# Patient Record
Sex: Male | Born: 1950 | Race: White | Hispanic: No | Marital: Married | Smoking: Current every day smoker
Health system: Southern US, Community
[De-identification: ages and names within clinical notes are randomized; demographics above are authoritative.]

## PROBLEM LIST (undated history)

## (undated) DIAGNOSIS — F32A Depression, unspecified: Secondary | ICD-10-CM

## (undated) DIAGNOSIS — N059 Unspecified nephritic syndrome with unspecified morphologic changes: Secondary | ICD-10-CM

## (undated) DIAGNOSIS — F329 Major depressive disorder, single episode, unspecified: Secondary | ICD-10-CM

## (undated) DIAGNOSIS — I10 Essential (primary) hypertension: Secondary | ICD-10-CM

## (undated) DIAGNOSIS — K219 Gastro-esophageal reflux disease without esophagitis: Secondary | ICD-10-CM

## (undated) DIAGNOSIS — F419 Anxiety disorder, unspecified: Secondary | ICD-10-CM

## (undated) HISTORY — PX: FINGER AMPUTATION: SHX636

## (undated) HISTORY — PX: OTHER SURGICAL HISTORY: SHX169

---

## 2013-11-04 ENCOUNTER — Ambulatory Visit: Payer: Self-pay

## 2015-09-13 ENCOUNTER — Other Ambulatory Visit: Payer: Self-pay | Admitting: Orthopedic Surgery

## 2015-09-13 DIAGNOSIS — M25512 Pain in left shoulder: Secondary | ICD-10-CM

## 2015-10-03 ENCOUNTER — Ambulatory Visit
Admission: RE | Admit: 2015-10-03 | Discharge: 2015-10-03 | Disposition: A | Discharge status: Home / Self Care | Payer: Medicare Other | Source: Ambulatory Visit | Attending: Orthopedic Surgery | Admitting: Orthopedic Surgery

## 2015-10-03 DIAGNOSIS — M25512 Pain in left shoulder: Secondary | ICD-10-CM | POA: Diagnosis not present

## 2015-10-03 DIAGNOSIS — M19012 Primary osteoarthritis, left shoulder: Secondary | ICD-10-CM | POA: Insufficient documentation

## 2015-10-03 DIAGNOSIS — M67814 Other specified disorders of tendon, left shoulder: Secondary | ICD-10-CM | POA: Diagnosis not present

## 2015-10-03 DIAGNOSIS — M25412 Effusion, left shoulder: Secondary | ICD-10-CM | POA: Diagnosis not present

## 2015-10-10 ENCOUNTER — Other Ambulatory Visit: Payer: Self-pay | Admitting: Orthopedic Surgery

## 2015-10-23 ENCOUNTER — Encounter
Admission: RE | Admit: 2015-10-23 | Discharge: 2015-10-23 | Disposition: A | Discharge status: Home / Self Care | Payer: Medicare Other | Source: Ambulatory Visit | Attending: Orthopedic Surgery | Admitting: Orthopedic Surgery

## 2015-10-23 ENCOUNTER — Other Ambulatory Visit: Payer: Medicare Other

## 2015-10-23 DIAGNOSIS — Z01812 Encounter for preprocedural laboratory examination: Secondary | ICD-10-CM | POA: Diagnosis not present

## 2015-10-23 DIAGNOSIS — Z01810 Encounter for preprocedural cardiovascular examination: Secondary | ICD-10-CM | POA: Diagnosis present

## 2015-10-23 DIAGNOSIS — I10 Essential (primary) hypertension: Secondary | ICD-10-CM | POA: Insufficient documentation

## 2015-10-23 HISTORY — DX: Major depressive disorder, single episode, unspecified: F32.9

## 2015-10-23 HISTORY — DX: Essential (primary) hypertension: I10

## 2015-10-23 HISTORY — DX: Anxiety disorder, unspecified: F41.9

## 2015-10-23 HISTORY — DX: Gastro-esophageal reflux disease without esophagitis: K21.9

## 2015-10-23 HISTORY — DX: Unspecified nephritic syndrome with unspecified morphologic changes: N05.9

## 2015-10-23 HISTORY — DX: Depression, unspecified: F32.A

## 2015-10-23 LAB — PROTIME-INR
INR: 1.02
Prothrombin Time: 13.6 seconds (ref 11.4–15.0)

## 2015-10-23 LAB — CBC WITH DIFFERENTIAL/PLATELET
Basophils Absolute: 0.1 10*3/uL (ref 0–0.1)
Basophils Relative: 1 %
Eosinophils Absolute: 0.1 10*3/uL (ref 0–0.7)
Eosinophils Relative: 2 %
HEMATOCRIT: 47 % (ref 40.0–52.0)
HEMOGLOBIN: 16.6 g/dL (ref 13.0–18.0)
LYMPHS ABS: 1.5 10*3/uL (ref 1.0–3.6)
LYMPHS PCT: 22 %
MCH: 33.4 pg (ref 26.0–34.0)
MCHC: 35.3 g/dL (ref 32.0–36.0)
MCV: 94.7 fL (ref 80.0–100.0)
MONOS PCT: 14 %
Monocytes Absolute: 1 10*3/uL (ref 0.2–1.0)
NEUTROS ABS: 4.2 10*3/uL (ref 1.4–6.5)
NEUTROS PCT: 61 %
Platelets: 244 10*3/uL (ref 150–440)
RBC: 4.96 MIL/uL (ref 4.40–5.90)
RDW: 13.2 % (ref 11.5–14.5)
WBC: 6.8 10*3/uL (ref 3.8–10.6)

## 2015-10-23 LAB — BASIC METABOLIC PANEL
ANION GAP: 3 — AB (ref 5–15)
BUN: 15 mg/dL (ref 6–20)
CHLORIDE: 104 mmol/L (ref 101–111)
CO2: 30 mmol/L (ref 22–32)
Calcium: 9.6 mg/dL (ref 8.9–10.3)
Creatinine, Ser: 1.03 mg/dL (ref 0.61–1.24)
GFR calc non Af Amer: >60 mL/min (ref >60)
Glucose, Bld: 105 mg/dL — ABNORMAL HIGH (ref 65–99)
POTASSIUM: 3.9 mmol/L (ref 3.5–5.1)
Sodium: 137 mmol/L (ref 135–145)

## 2015-10-23 LAB — APTT: aPTT: 27 seconds (ref 24–36)

## 2015-10-23 NOTE — Patient Instructions (Signed)
  Your procedure is scheduled on: Wednesday 11/01/15 Report to Day Surgery. 2ND FLOOR MEDICAL MALL ENTRANCE To find out your arrival time please call [phone] between 1PM - 3PM on Tuesday 10/31/15.  Remember: Instructions that are not followed completely may result in serious medical risk, up to and including death, or upon the discretion of your surgeon and anesthesiologist your surgery may need to be rescheduled.    __X__ 1. Do not eat food or drink liquids after midnight. No gum chewing or hard candies.     __X__ 2. No Alcohol for 24 hours before or after surgery.   ____ 3. Bring all medications with you on the day of surgery if instructed.    __X__ 4. Notify your doctor if there is any change in your medical condition     (cold, fever, infections).     Do not wear jewelry, make-up, hairpins, clips or nail polish.  Do not wear lotions, powders, or perfumes.   Do not shave 48 hours prior to surgery. Men may shave face and neck.  Do not bring valuables to the hospital.    [facility] belongings or valuables.               Contacts, dentures or bridgework may not be worn into surgery.  Leave your suitcase in the car. After surgery it may be brought to your room.  For patients admitted to the hospital, discharge time is determined by your                treatment team.   Patients discharged the day of surgery will not be allowed to drive home.   Please read over the following fact sheets that you were given:   Surgical Site Infection Prevention   __X__ Take these medicines the morning of surgery with A SIP OF WATER:    1. CLONAZEPAM  2. EFFEXOR  3. RANITIDINE AT BEDTIME AND DAY OF   4.  5.  6.  ____ Fleet Enema (as directed)   __X__ USE CHG SOAP  ____ Use inhalers on the day of surgery  ____ Stop metformin 2 days prior to surgery    ____ Take 1/2 of usual insulin dose the night before surgery and none on the morning of surgery.   __X__ Stop  Coumadin/Plavix/aspirin on 10/24/15 LAST DOSE  __X__ Stop Anti-inflammatories on 10/24/15 LAST DOSE OF IBUPROFEN   ____ Stop supplements until after surgery.    ____ Bring C-Pap to the hospital.

## 2015-11-01 ENCOUNTER — Ambulatory Visit
Admission: RE | Admit: 2015-11-01 | Discharge: 2015-11-01 | Disposition: A | Discharge status: Home / Self Care | Payer: Medicare Other | Source: Ambulatory Visit | Attending: Orthopedic Surgery | Admitting: Orthopedic Surgery

## 2015-11-01 ENCOUNTER — Ambulatory Visit: Payer: Medicare Other | Admitting: Anesthesiology

## 2015-11-01 ENCOUNTER — Encounter: Payer: Self-pay | Admitting: *Deleted

## 2015-11-01 ENCOUNTER — Encounter
Admission: RE | Disposition: A | Discharge status: Home / Self Care | Payer: Self-pay | Source: Ambulatory Visit | Attending: Orthopedic Surgery

## 2015-11-01 DIAGNOSIS — M75102 Unspecified rotator cuff tear or rupture of left shoulder, not specified as traumatic: Secondary | ICD-10-CM | POA: Diagnosis not present

## 2015-11-01 DIAGNOSIS — M7552 Bursitis of left shoulder: Secondary | ICD-10-CM | POA: Insufficient documentation

## 2015-11-01 DIAGNOSIS — J449 Chronic obstructive pulmonary disease, unspecified: Secondary | ICD-10-CM | POA: Insufficient documentation

## 2015-11-01 DIAGNOSIS — M7542 Impingement syndrome of left shoulder: Secondary | ICD-10-CM | POA: Diagnosis not present

## 2015-11-01 DIAGNOSIS — I10 Essential (primary) hypertension: Secondary | ICD-10-CM | POA: Diagnosis not present

## 2015-11-01 DIAGNOSIS — M19012 Primary osteoarthritis, left shoulder: Secondary | ICD-10-CM | POA: Insufficient documentation

## 2015-11-01 DIAGNOSIS — K219 Gastro-esophageal reflux disease without esophagitis: Secondary | ICD-10-CM | POA: Insufficient documentation

## 2015-11-01 DIAGNOSIS — F418 Other specified anxiety disorders: Secondary | ICD-10-CM | POA: Diagnosis not present

## 2015-11-01 DIAGNOSIS — F17210 Nicotine dependence, cigarettes, uncomplicated: Secondary | ICD-10-CM | POA: Insufficient documentation

## 2015-11-01 HISTORY — PX: SHOULDER ARTHROSCOPY WITH OPEN ROTATOR CUFF REPAIR: SHX6092

## 2015-11-01 SURGERY — ARTHROSCOPY, SHOULDER WITH REPAIR, ROTATOR CUFF, OPEN
Anesthesia: General | Site: Shoulder | Laterality: Left | Wound class: Clean

## 2015-11-01 MED ORDER — IPRATROPIUM-ALBUTEROL 0.5-2.5 (3) MG/3ML IN SOLN
RESPIRATORY_TRACT | Status: AC
  Administered 2015-11-01: 3 mL via RESPIRATORY_TRACT
  Filled 2015-11-01: qty 3

## 2015-11-01 MED ORDER — BUPIVACAINE HCL (PF) 0.25 % IJ SOLN
INTRAMUSCULAR | Status: AC
  Filled 2015-11-01: qty 30

## 2015-11-01 MED ORDER — LACTATED RINGERS IV SOLN
INTRAVENOUS | Status: DC
  Administered 2015-11-01 (×3): via INTRAVENOUS

## 2015-11-01 MED ORDER — ROCURONIUM BROMIDE 100 MG/10ML IV SOLN
INTRAVENOUS | Status: DC | PRN
  Administered 2015-11-01: 20 mg via INTRAVENOUS
  Administered 2015-11-01: 40 mg via INTRAVENOUS
  Administered 2015-11-01: 20 mg via INTRAVENOUS

## 2015-11-01 MED ORDER — FENTANYL CITRATE (PF) 100 MCG/2ML IJ SOLN: 25.0000 ug | INTRAMUSCULAR | Status: DC | PRN

## 2015-11-01 MED ORDER — EPINEPHRINE HCL 1 MG/ML IJ SOLN
INTRAMUSCULAR | Status: AC
  Filled 2015-11-01: qty 1

## 2015-11-01 MED ORDER — CEFAZOLIN SODIUM 10 G IJ SOLR
3.0000 g | INTRAMUSCULAR | Status: DC
  Filled 2015-11-01: qty 3000

## 2015-11-01 MED ORDER — PHENYLEPHRINE HCL 10 MG/ML IJ SOLN
INTRAMUSCULAR | Status: DC | PRN
  Administered 2015-11-01 (×3): 100 ug via INTRAVENOUS

## 2015-11-01 MED ORDER — LIDOCAINE HCL (CARDIAC) 20 MG/ML IV SOLN
INTRAVENOUS | Status: DC | PRN
  Administered 2015-11-01: 40 mg via INTRAVENOUS

## 2015-11-01 MED ORDER — ONDANSETRON HCL 4 MG PO TABS: 4.0000 mg | ORAL_TABLET | Freq: Three times a day (TID) | ORAL | Status: AC | PRN

## 2015-11-01 MED ORDER — LIDOCAINE HCL 1 % IJ SOLN
INTRAMUSCULAR | Status: DC | PRN
  Administered 2015-11-01: 21 mL

## 2015-11-01 MED ORDER — ONDANSETRON HCL 4 MG/2ML IJ SOLN
INTRAMUSCULAR | Status: DC | PRN
  Administered 2015-11-01: 4 mg via INTRAVENOUS

## 2015-11-01 MED ORDER — MIDAZOLAM HCL 2 MG/2ML IJ SOLN
INTRAMUSCULAR | Status: DC | PRN
  Administered 2015-11-01: 2 mg via INTRAVENOUS

## 2015-11-01 MED ORDER — OXYCODONE HCL 5 MG PO TABS: 5.0000 mg | ORAL_TABLET | ORAL | Status: AC | PRN

## 2015-11-01 MED ORDER — PROPOFOL 10 MG/ML IV BOLUS
INTRAVENOUS | Status: DC | PRN
  Administered 2015-11-01: 180 mg via INTRAVENOUS

## 2015-11-01 MED ORDER — GLYCOPYRROLATE 0.2 MG/ML IJ SOLN
INTRAMUSCULAR | Status: DC | PRN
  Administered 2015-11-01: .2 mg via INTRAVENOUS

## 2015-11-01 MED ORDER — CHLORHEXIDINE GLUCONATE 4 % EX LIQD: 1.0000 "application " | Freq: Once | CUTANEOUS | Status: DC

## 2015-11-01 MED ORDER — NEOMYCIN-POLYMYXIN B GU 40-200000 IR SOLN
Status: AC
  Filled 2015-11-01: qty 2

## 2015-11-01 MED ORDER — SUGAMMADEX SODIUM 200 MG/2ML IV SOLN
INTRAVENOUS | Status: DC | PRN
  Administered 2015-11-01: 200 mg via INTRAVENOUS

## 2015-11-01 MED ORDER — OXYCODONE HCL 5 MG PO TABS
ORAL_TABLET | ORAL | Status: AC
  Filled 2015-11-01: qty 1

## 2015-11-01 MED ORDER — BISOPROLOL FUMARATE 5 MG PO TABS
5.0000 mg | ORAL_TABLET | Freq: Once | ORAL | Status: AC
  Administered 2015-11-01: 5 mg via ORAL
  Filled 2015-11-01: qty 1

## 2015-11-01 MED ORDER — FENTANYL CITRATE (PF) 100 MCG/2ML IJ SOLN
INTRAMUSCULAR | Status: DC | PRN
  Administered 2015-11-01 (×2): 100 ug via INTRAVENOUS

## 2015-11-01 MED ORDER — EPINEPHRINE HCL 1 MG/ML IJ SOLN
INTRAMUSCULAR | Status: DC | PRN
  Administered 2015-11-01: 8 mL

## 2015-11-01 MED ORDER — LIDOCAINE HCL (PF) 1 % IJ SOLN
INTRAMUSCULAR | Status: AC
  Filled 2015-11-01: qty 30

## 2015-11-01 MED ORDER — EPHEDRINE SULFATE 50 MG/ML IJ SOLN
INTRAMUSCULAR | Status: DC | PRN
  Administered 2015-11-01: 15 mg via INTRAVENOUS
  Administered 2015-11-01 (×4): 10 mg via INTRAVENOUS

## 2015-11-01 MED ORDER — OXYCODONE HCL 5 MG PO TABS
5.0000 mg | ORAL_TABLET | Freq: Once | ORAL | Status: AC | PRN
  Administered 2015-11-01: 5 mg via ORAL

## 2015-11-01 MED ORDER — OXYCODONE HCL 5 MG/5ML PO SOLN: 5.0000 mg | Freq: Once | ORAL | Status: AC | PRN

## 2015-11-01 MED ORDER — IPRATROPIUM-ALBUTEROL 0.5-2.5 (3) MG/3ML IN SOLN
3.0000 mL | Freq: Once | RESPIRATORY_TRACT | Status: AC
  Administered 2015-11-01: 3 mL via RESPIRATORY_TRACT

## 2015-11-01 MED ORDER — BUPIVACAINE HCL 0.25 % IJ SOLN
INTRAMUSCULAR | Status: DC | PRN
  Administered 2015-11-01: 30 mL

## 2015-11-01 MED ORDER — DEXAMETHASONE SODIUM PHOSPHATE 10 MG/ML IJ SOLN
INTRAMUSCULAR | Status: DC | PRN
  Administered 2015-11-01: 10 mg via INTRAVENOUS

## 2015-11-01 MED ORDER — FENTANYL CITRATE (PF) 100 MCG/2ML IJ SOLN
INTRAMUSCULAR | Status: AC
  Filled 2015-11-01: qty 2

## 2015-11-01 SURGICAL SUPPLY — 68 items
ADAPTER IRRIG TUBE 2 SPIKE SOL (ADAPTER) ×6 IMPLANT
BUR RADIUS 4.0X18.5 (BURR) ×3 IMPLANT
BUR RADIUS 5.5 (BURR) ×3 IMPLANT
CANNULA 5.75X7 CRYSTAL CLEAR (CANNULA) ×6 IMPLANT
CANNULA PARTIAL THREAD 2X7 (CANNULA) ×3 IMPLANT
CANNULA TWIST IN 8.25X9CM (CANNULA) ×6 IMPLANT
CLOSURE WOUND 1/2 X4 (GAUZE/BANDAGES/DRESSINGS) ×1
CONNECTOR PERFECT PASSER (CONNECTOR) ×6 IMPLANT
COOLER POLAR GLACIER W/PUMP (MISCELLANEOUS) ×3 IMPLANT
CRADLE LAMINECT ARM (MISCELLANEOUS) ×3 IMPLANT
DRAPE IMP U-DRAPE 54X76 (DRAPES) ×6 IMPLANT
DRAPE INCISE IOBAN 66X45 STRL (DRAPES) ×3 IMPLANT
DRAPE SHEET LG 3/4 BI-LAMINATE (DRAPES) ×3 IMPLANT
DRAPE U-SHAPE 47X51 STRL (DRAPES) ×3 IMPLANT
DURAPREP 26ML APPLICATOR (WOUND CARE) ×9 IMPLANT
ELECT REM PT RETURN 9FT ADLT (ELECTROSURGICAL) ×3
ELECTRODE REM PT RTRN 9FT ADLT (ELECTROSURGICAL) ×1 IMPLANT
GAUZE PETRO XEROFOAM 1X8 (MISCELLANEOUS) ×3 IMPLANT
GAUZE SPONGE 4X4 12PLY STRL (GAUZE/BANDAGES/DRESSINGS) ×6 IMPLANT
GLOVE BIOGEL PI IND STRL 9 (GLOVE) ×1 IMPLANT
GLOVE BIOGEL PI INDICATOR 9 (GLOVE) ×2
GLOVE SURG 9.0 ORTHO LTXF (GLOVE) ×6 IMPLANT
GOWN STRL REUS TWL 2XL XL LVL4 (GOWN DISPOSABLE) IMPLANT
GOWN STRL REUS W/ TWL LRG LVL3 (GOWN DISPOSABLE) ×1 IMPLANT
GOWN STRL REUS W/ TWL LRG LVL4 (GOWN DISPOSABLE) ×1 IMPLANT
GOWN STRL REUS W/TWL LRG LVL3 (GOWN DISPOSABLE) ×2
GOWN STRL REUS W/TWL LRG LVL4 (GOWN DISPOSABLE) ×2
IV LACTATED RINGER IRRG 3000ML (IV SOLUTION) ×22
IV LR IRRIG 3000ML ARTHROMATIC (IV SOLUTION) ×11 IMPLANT
KIT RM TURNOVER STRD PROC AR (KITS) ×3 IMPLANT
KIT STABILIZATION SHOULDER (MISCELLANEOUS) ×3 IMPLANT
KIT SUTURE 2.8 Q-FIX DISP (MISCELLANEOUS) ×3 IMPLANT
KIT SUTURETAK 3.0 INSERT PERC (KITS) IMPLANT
MANIFOLD NEPTUNE II (INSTRUMENTS) ×3 IMPLANT
MASK FACE SPIDER DISP (MASK) ×3 IMPLANT
MAT BLUE FLOOR 46X72 FLO (MISCELLANEOUS) ×6 IMPLANT
NDL SAFETY 18GX1.5 (NEEDLE) ×3 IMPLANT
NDL SAFETY 22GX1.5 (NEEDLE) ×3 IMPLANT
NS IRRIG 500ML POUR BTL (IV SOLUTION) IMPLANT
PACK ARTHROSCOPY SHOULDER (MISCELLANEOUS) ×3 IMPLANT
PAD WRAPON POLAR SHDR UNIV (MISCELLANEOUS) ×1 IMPLANT
PAD WRAPON POLAR SHDR XLG (MISCELLANEOUS) ×1 IMPLANT
PASSER SUT CAPTURE FIRST (SUTURE) ×3 IMPLANT
SET TUBE SUCT SHAVER OUTFL 24K (TUBING) ×3 IMPLANT
SET TUBE TIP INTRA-ARTICULAR (MISCELLANEOUS) ×3 IMPLANT
SLING ARM LRG DEEP (SOFTGOODS) ×3 IMPLANT
STRAP SAFETY BODY (MISCELLANEOUS) ×3 IMPLANT
STRIP CLOSURE SKIN 1/2X4 (GAUZE/BANDAGES/DRESSINGS) ×2 IMPLANT
SUT ETHILON 4-0 (SUTURE) ×4
SUT ETHILON 4-0 FS2 18XMFL BLK (SUTURE) ×2
SUT LASSO 90 DEG SD STR (SUTURE) IMPLANT
SUT MNCRL 4-0 (SUTURE)
SUT MNCRL 4-0 27XMFL (SUTURE)
SUT PDS AB 0 CT1 27 (SUTURE) IMPLANT
SUT PERFECTPASSER WHITE CART (SUTURE) ×12 IMPLANT
SUT VIC AB 0 CT1 36 (SUTURE) IMPLANT
SUT VIC AB 2-0 CT2 27 (SUTURE) IMPLANT
SUTURE ETHLN 4-0 FS2 18XMF BLK (SUTURE) ×2 IMPLANT
SUTURE MAGNUM WIRE 2X48 BLK (SUTURE) ×6 IMPLANT
SUTURE MNCRL 4-0 27XMF (SUTURE) IMPLANT
SYRINGE 10CC LL (SYRINGE) ×3 IMPLANT
TAPE MICROFOAM 4IN (TAPE) ×3 IMPLANT
TUBING ARTHRO INFLOW-ONLY STRL (TUBING) ×3 IMPLANT
TUBING CONNECTING 10 (TUBING) ×2 IMPLANT
TUBING CONNECTING 10' (TUBING) ×1
WAND HAND CNTRL MULTIVAC 90 (MISCELLANEOUS) ×3 IMPLANT
WRAPON POLAR PAD SHDR UNIV (MISCELLANEOUS) ×3
WRAPON POLAR PAD SHDR XLG (MISCELLANEOUS) ×3

## 2015-11-01 NOTE — Discharge Instructions (Signed)

## 2015-11-01 NOTE — Transfer of Care (Signed)
Immediate Anesthesia Transfer of Care Note  Patient: Steven Marquez  Procedure(s) Performed: Procedure(s): SHOULDER ARTHROSCOPY WITH OPEN ROTATOR CUFF REPAIR (Left)  Patient Location: PACU  Anesthesia Type:General  Level of Consciousness: awake, alert , oriented and patient cooperative  Airway & Oxygen Therapy: Patient Spontanous Breathing and Patient connected to face mask oxygen  Post-op Assessment: Report given to RN, Post -op Vital signs reviewed and stable and Patient moving all extremities X 4  Post vital signs: Reviewed and stable  Last Vitals:  Filed Vitals:   11/01/15 1157 11/01/15 1643  BP: 177/90 128/70  Pulse: 58 60  Temp: 36.7 C 37.2 C  Resp: 16 18    Complications: No apparent anesthesia complications

## 2015-11-01 NOTE — Anesthesia Preprocedure Evaluation (Signed)
Anesthesia Evaluation  Patient identified by MRN, date of birth, ID band Patient awake    Reviewed: Allergy & Precautions, H&P , NPO status , Patient's Chart, lab work & pertinent test results  History of Anesthesia Complications Negative for: history of anesthetic complications  Airway Mallampati: II  TM Distance: >3 FB Neck ROM: limited    Dental  (+) Poor Dentition, Chipped   Pulmonary neg shortness of breath, COPD, Current Smoker,    Pulmonary exam normal breath sounds clear to auscultation       Cardiovascular Exercise Tolerance: Good hypertension, (-) angina(-) Past MI and (-) DOE Normal cardiovascular exam Rhythm:regular Rate:Normal     Neuro/Psych PSYCHIATRIC DISORDERS Anxiety Depression negative neurological ROS     GI/Hepatic Neg liver ROS, GERD  Controlled,  Endo/Other  negative endocrine ROS  Renal/GU Renal disease  negative genitourinary   Musculoskeletal   Abdominal   Peds  Hematology negative hematology ROS (+)   Anesthesia Other Findings Past Medical History:   Hypertension                                                 Anxiety                                                      Depression                                                   GERD (gastroesophageal reflux disease)                       Nephritis                                                      Comment:at age 67 or 8  Past Surgical History:   hematocele                                                    vasectomey                                                    FINGER AMPUTATION                               Right                Reproductive/Obstetrics negative OB ROS  Anesthesia Physical Anesthesia Plan  ASA: III  Anesthesia Plan: General ETT   Post-op Pain Management:    Induction:   Airway Management Planned:   Additional Equipment:   Intra-op Plan:    Post-operative Plan:   Informed Consent: I have reviewed the patients History and Physical, chart, labs and discussed the procedure including the risks, benefits and alternatives for the proposed anesthesia with the patient or authorized representative who has indicated his/her understanding and acceptance.   Dental Advisory Given  Plan Discussed with: Anesthesiologist, CRNA and Surgeon  Anesthesia Plan Comments:         Anesthesia Quick Evaluation

## 2015-11-01 NOTE — Op Note (Signed)
11/01/2015  4:38 PM  PATIENT:  Steven Marquez  65 y.o. 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 male with a diagnosis of M75.42 Impingement syndrome of left shoulder who failed conservative measures and elected for surgical management.    The risks benefits and alternatives were discussed with the patient preoperatively including but not limited to the risks of infection, bleeding, nerve injury, persistent pain or weakness, failure of the hardware, re-tear of the rotator cuff and the need for further surgery. Medical risks include DVT and pulmonary embolism, myocardial infarction, stroke, pneumonia, respiratory failure and death. Patient understood these risks and wished to proceed.  OPERATIVE PROCEDURE: The patient was met in the preoperative area. The left shoulder was signed with the word yes and my initials according the hospital's correct site of surgery protocol. The patient is brought to the OR and underwent general endotracheal intubation by the anesthesia service.  The patient was placed in a beachchair position. A spider arm positioner was used for this case. Examination under anesthesia revealed no instability with load shift testing. He had a full passive range of motion. He had a negative sulcus sign..  The patient was prepped and draped in a sterile fashion. A timeout was performed to verify the patient's name, date of birth, medical record number, correct site of surgery and correct  procedure to be performed there was also used to verify the patient received antibiotics that all appropriate instruments, implants and radiographs studies were available in the room. Once all in attendance were in agreement case began.  Bony landmarks were drawn out with a surgical marker along with proposed arthroscopy incisions. These were pre-injected with 1% lidocaine plain. An 11 blade was used to establish a posterior portal through which the arthroscope was placed in the glenohumeral joint. A full diagnostic examination of the shoulder was performed. The anterior portal was established under direct visualization with an 18-gauge spinal needle.  A 5.75 mm arthroscopic cannula was placed through the anterior portal.   The intra-articular portion of the biceps tendon was erythematous and thickened consistent with tendinosis. Patient was also found to have a tear of the superior labrum (SLAP tear). Due to the tendinosis and SLAP tear of decision was made to perform a tenotomy. I spoke with the patient's wife from the waiting room to inform her of the surgical findings and my recommendation for a biceps tenotomy. I discussed this with the patient's wife who was in the surgical waiting room by phone. I discussed my recommendation for a biceps tenotomy with her because I had not previously discussed this with the patient. The MRI had not shown a SLAP tear. She agreed to proceed with the biceps tenotomy in an effort to resolve any pain in the patient's left shoulder.  An arthroscopic scissor was used to release the biceps tendon off the superior labrum. The arthroscopic shaver was then used to debride the frayed edges of the labrum.  Subscapularis tendon was intact but had fraying of the superior fibers without full-thickness tear or retraction. The rotator cuff  was intact. There were no loose bodies within the inferior recess and no evidence of HAGL lesion.  The arthroscope was then placed in the  subacromial space. A lateral portal was then established using an 18-gauge spinal needle for localization.  Extensive bursitis was encountered and debrided using a 4.0 resector shaver blade and a 90 ArthroCare wand from the lateral portal. A subacromial decompression was also performed using a 5.5 mm resector shaver blade from the lateral portal. The 5.5 mm resector shaver blade was then placed through the anterior portal and distal clavicle excision was performed.   All incisions were copiously irrigated.  Skin closure for the arthroscopic incisions was performed with 4-0 nylon.  0.25%  Marcaine plain was injected into the subacromial space and at the injection sites.  A dry sterile dressing including Steri-Strips was applied . The patient was placed in an abduction sling, with a Polar Care sleeve.  All sharp and instrument counts were correct at the conclusion of the case. I was scrubbed and present for the entire case. I spoke with the patient's wife in the post-op consultation room and informed her that the case had been performed without complication and the patient was stable in recovery room.     Timoteo Gaul, MD

## 2015-11-01 NOTE — Anesthesia Postprocedure Evaluation (Signed)
Anesthesia Post Note  Patient: Steven Marquez  Procedure(s) Performed: Procedure(s) (LRB): SHOULDER ARTHROSCOPY WITH OPEN ROTATOR CUFF REPAIR (Left)  Patient location during evaluation: PACU Anesthesia Type: General Level of consciousness: awake and alert Pain management: pain level controlled Vital Signs Assessment: post-procedure vital signs reviewed and stable Respiratory status: spontaneous breathing and respiratory function stable Cardiovascular status: stable Anesthetic complications: no    Last Vitals:  Filed Vitals:   11/01/15 1737 11/01/15 1810  BP: 143/78 136/92  Pulse: 76 83  Temp:  36.4 C  Resp: 16 16    Last Pain:  Filed Vitals:   11/01/15 1812  PainSc: 3                  Domanic Matusek K

## 2015-11-01 NOTE — H&P (Signed)
The patient has been re-examined, and the chart reviewed, and there have been no interval changes to the documented history and physical.    The risks, benefits, and alternatives have been discussed at length, and the patient is willing to proceed.   

## 2015-11-01 NOTE — Anesthesia Procedure Notes (Addendum)
Procedure Name: Intubation Date/Time: 11/01/2015 2:45 PM Performed by: Michaele OfferSAVAGE, Traves Majchrzak Pre-anesthesia Checklist: Patient identified, Emergency Drugs available, Suction available, Patient being monitored and Timeout performed Patient Re-evaluated:Patient Re-evaluated prior to inductionOxygen Delivery Method: Circle system utilized Preoxygenation: Pre-oxygenation with 100% oxygen Intubation Type: IV induction Ventilation: Mask ventilation without difficulty Laryngoscope Size: Mac and 4 Grade View: Grade I Tube type: Oral Tube size: 7.5 mm Number of attempts: 1 Airway Equipment and Method: Stylet Placement Confirmation: ETT inserted through vocal cords under direct vision,  positive ETCO2 and breath sounds checked- equal and bilateral Secured at: 24 cm Tube secured with: Tape Dental Injury: Teeth and Oropharynx as per pre-operative assessment  Comments: ETT placed by Ledell PeoplesKim Pope CRNA

## 2015-11-02 ENCOUNTER — Encounter: Payer: Self-pay | Admitting: Orthopedic Surgery

## 2017-03-02 IMAGING — MR MR SHOULDER*L* W/O CM
5 series · 40 of 40 positions shown · non-contrast
Comparison: None.

CLINICAL DATA: Intermittent left shoulder pain for 4-5 years.
Limited range of motion. No known injury. Initial encounter.

EXAM:
MRI OF THE LEFT SHOULDER WITHOUT CONTRAST
TECHNIQUE: Multiplanar, multisequence MR imaging of the shoulder was performed.
No intravenous contrast was administered.

[Series 4: T2 fat-sat · axial · 4.0mm · 0.47mm/px · z∈[-32,+73]mm · 8 of 25 slices shown (1 of 3)]
[im 1/25]
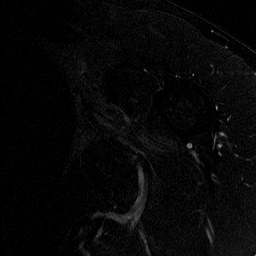
[im 4/25]
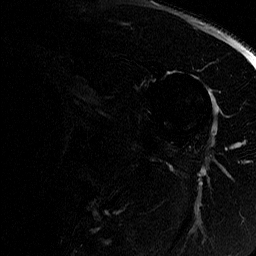
[im 7/25]
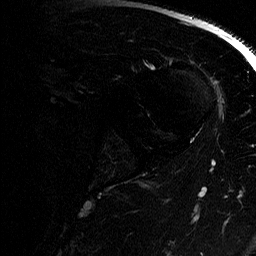
[im 11/25]
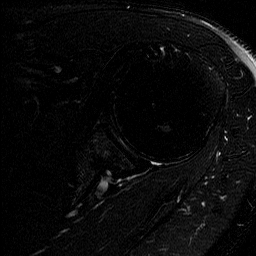
[im 14/25]
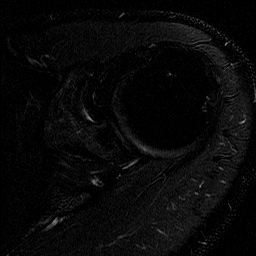
[im 18/25]
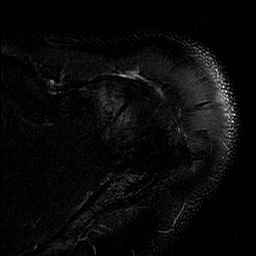
[im 21/25]
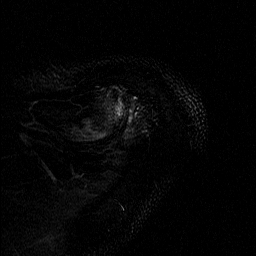
[im 25/25]
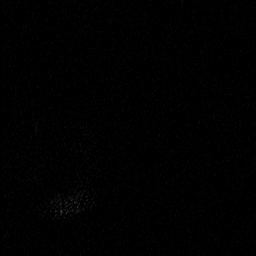

[Series 5: T2 fat-sat · oblique · 4.0mm · 0.62mm/px · 8 of 23 slices shown (2 of 3)]
[im 1/23]
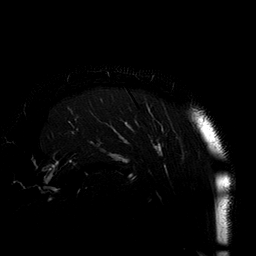
[im 4/23]
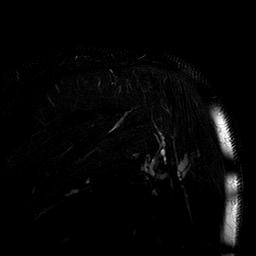
[im 7/23]
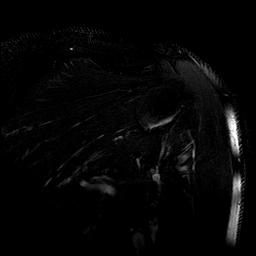
[im 10/23]
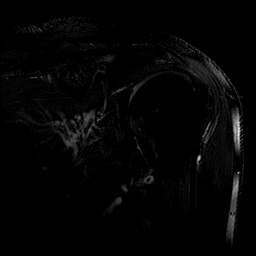
[im 13/23]
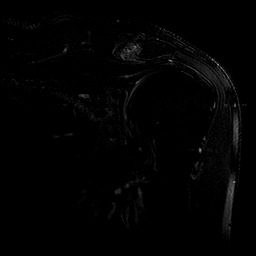
[im 16/23]
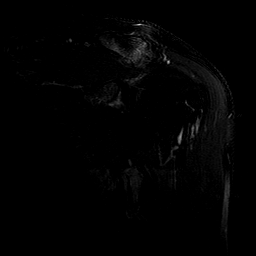
[im 19/23]
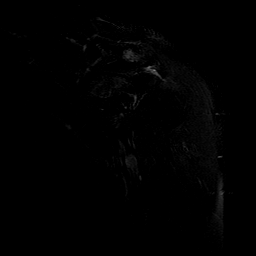
[im 23/23]
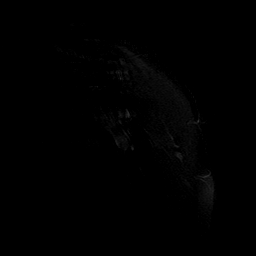

[Series 6: PD · oblique · 4.0mm · 0.62mm/px · 8 of 23 slices shown]
[im 1/23]
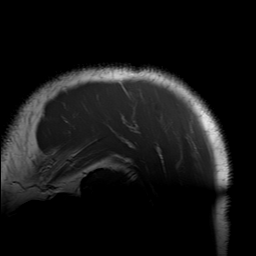
[im 4/23]
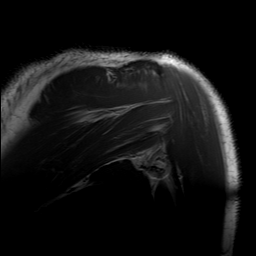
[im 7/23]
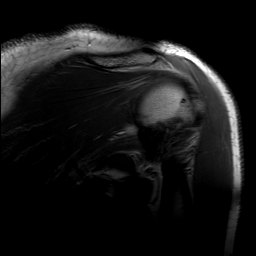
[im 10/23]
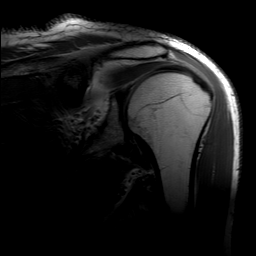
[im 13/23]
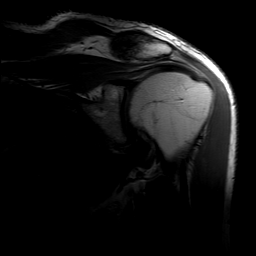
[im 16/23]
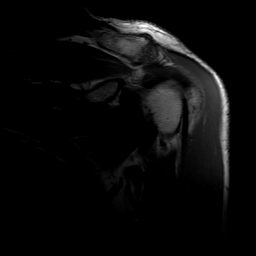
[im 19/23]
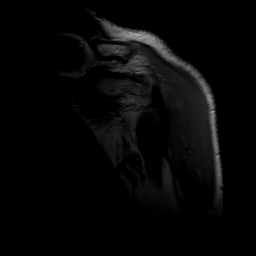
[im 23/23]
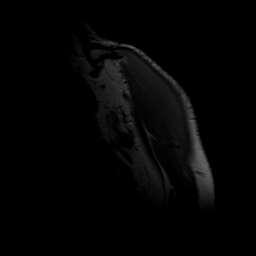

[Series 7: T1 · oblique · 4.0mm · 0.62mm/px · 8 of 25 slices shown]
[im 1/25]
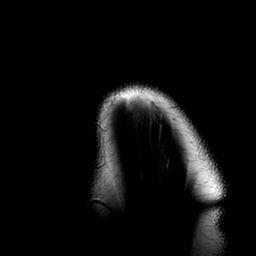
[im 4/25]
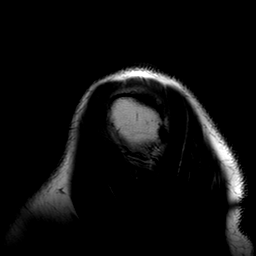
[im 7/25]
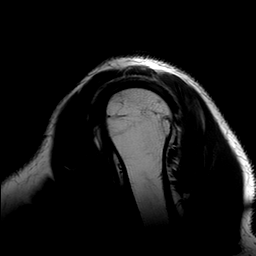
[im 11/25]
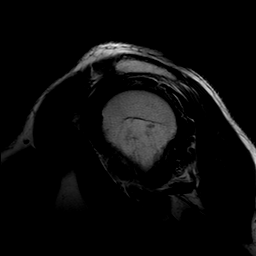
[im 14/25]
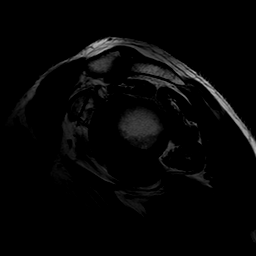
[im 18/25]
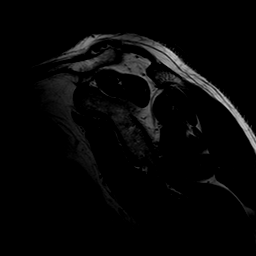
[im 21/25]
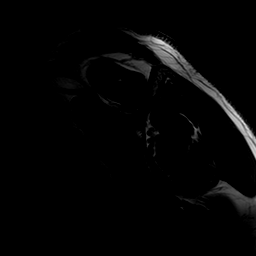
[im 25/25]
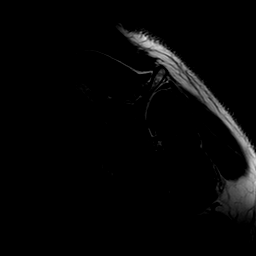

[Series 8: T2 fat-sat · oblique · 4.0mm · 0.62mm/px · 8 of 25 slices shown (3 of 3)]
[im 1/25]
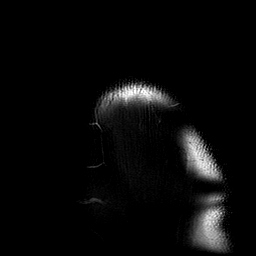
[im 4/25]
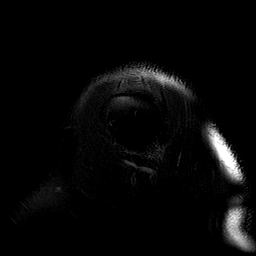
[im 7/25]
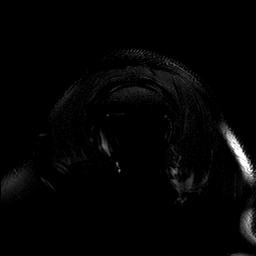
[im 11/25]
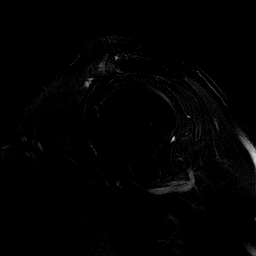
[im 14/25]
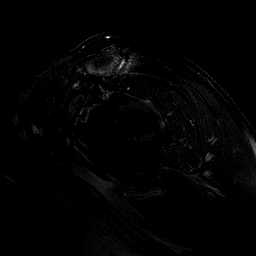
[im 18/25]
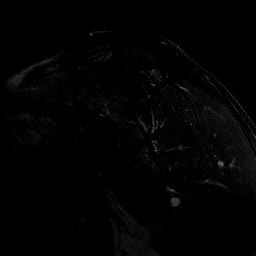
[im 21/25]
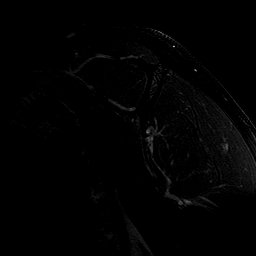
[im 25/25]
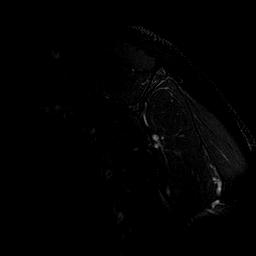

[40 of 40 positions shown; findings below may reference images not displayed]

FINDINGS: Rotator cuff: The patient has mild appearing supraspinatus and
infraspinatus tendinopathy without tear. The subscapularis and
infraspinatus are unremarkable.

Muscles:  Normal in appearance without atrophy or focal lesion.

Biceps long head:  Intact.

Acromioclavicular Joint: Bulky degenerative change with intense
marrow about the joint is seen.

Glenohumeral Joint: Unremarkable.

Labrum:  Intact.

Bones: No fracture or worrisome marrow lesion. The acromion is type
2.
IMPRESSION: Dominant finding is bulky acromioclavicular osteoarthritis with
intense marrow edema about the joint.

Mild appearing supraspinatus and infraspinatus tendinopathy without
tear.
